# Patient Record
Sex: Male | Born: 1975 | Race: Black or African American | Hispanic: No | Marital: Single
Health system: Southern US, Community
[De-identification: ages and names within clinical notes are randomized; demographics above are authoritative.]

---

## 2005-03-02 ENCOUNTER — Emergency Department: Payer: Self-pay | Admitting: Emergency Medicine

## 2005-11-27 ENCOUNTER — Emergency Department: Payer: Self-pay | Admitting: Internal Medicine

## 2006-01-18 ENCOUNTER — Emergency Department: Payer: Self-pay | Admitting: Emergency Medicine

## 2007-01-09 ENCOUNTER — Emergency Department: Payer: Self-pay | Admitting: Emergency Medicine

## 2007-10-30 IMAGING — CT CT HEAD WITHOUT CONTRAST
2 series · 16 of 30 positions shown, 20 images · non-contrast
Comparison: none

REASON FOR EXAM: Headaches
COMMENTS:  LMP: (Male)

PROCEDURE:     CT  - CT HEAD WITHOUT CONTRAST  - November 27, 2005 [DATE]
RESULT:     Unenhanced head CT scan was obtained for headaches.

[Series 2: without · axial · non-contrast · 0.42mm/px · z∈[-136,-16]mm · 13 of 30 slices shown, 17 images]
[im 3/30  brain]
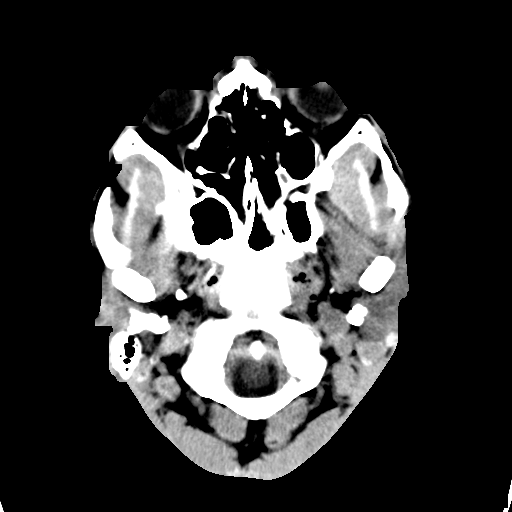
[im 3/30  bone]
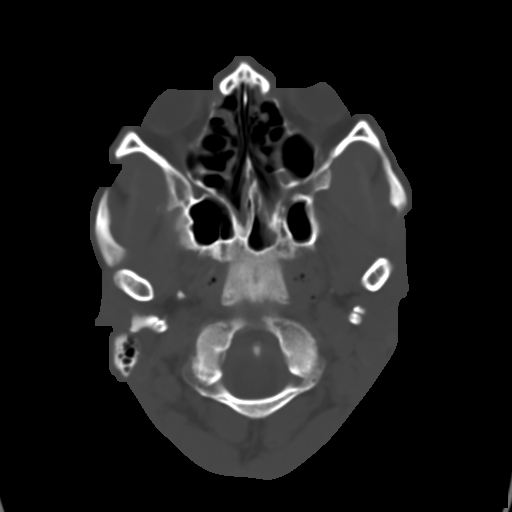
[im 5/30  brain]
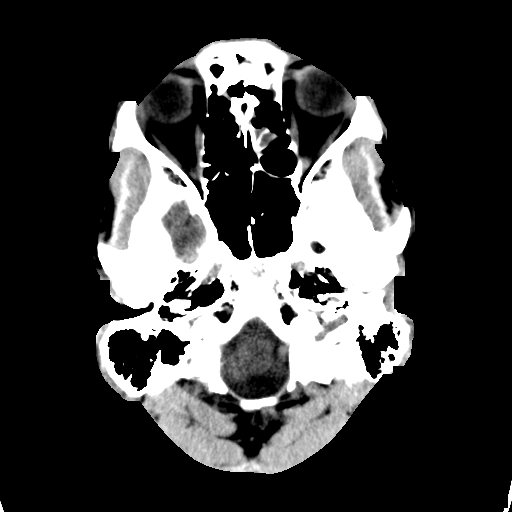
[im 7/30  brain]
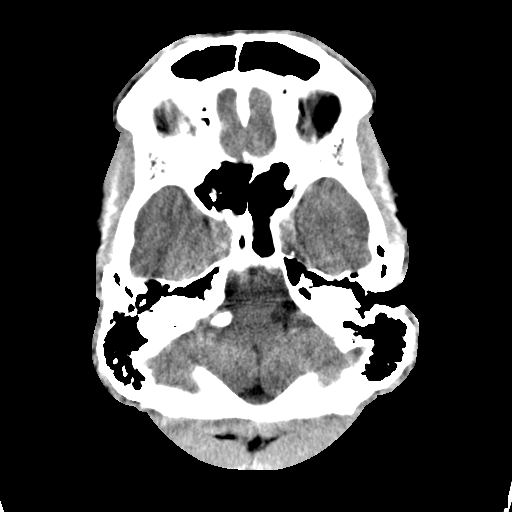
[im 9/30  brain]
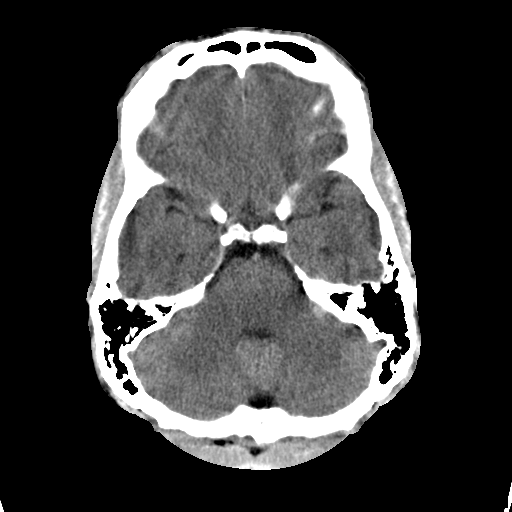
[im 11/30  brain]
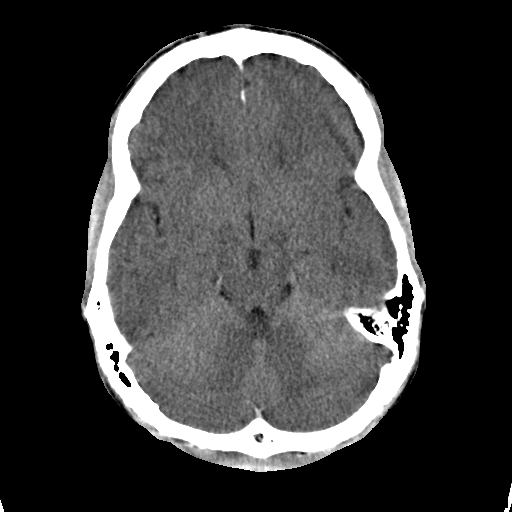
[im 11/30  bone]
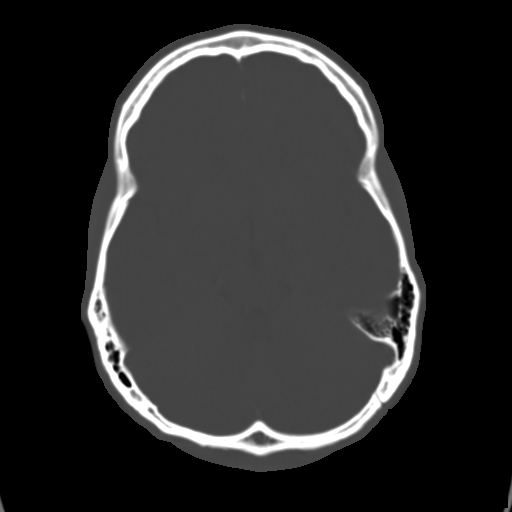
[im 13/30  brain]
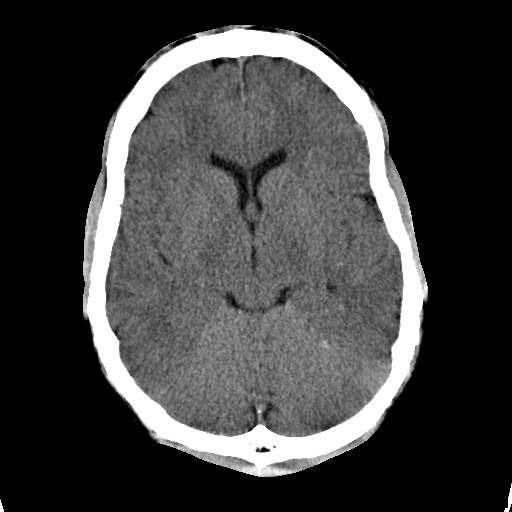
[im 15/30  brain]
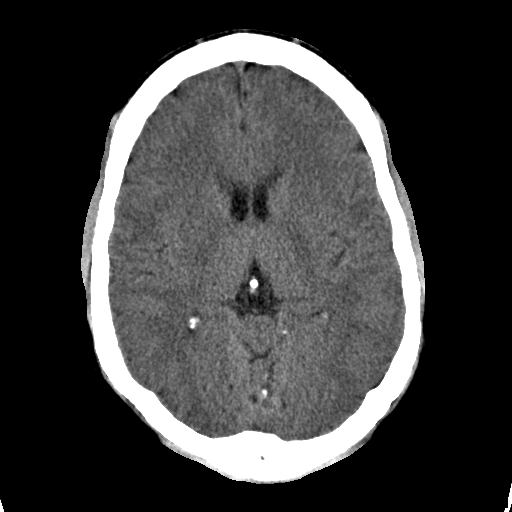
[im 17/30  brain]
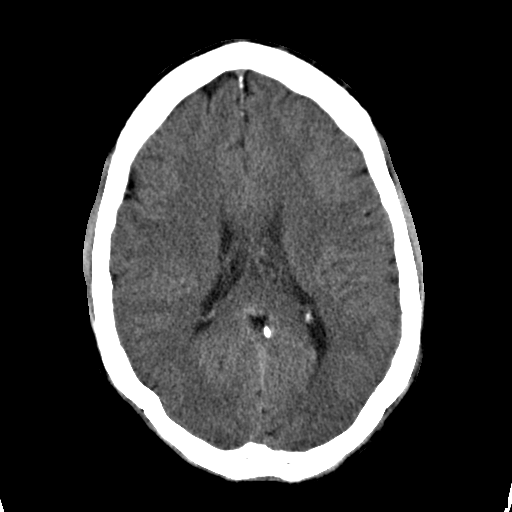
[im 19/30  brain]
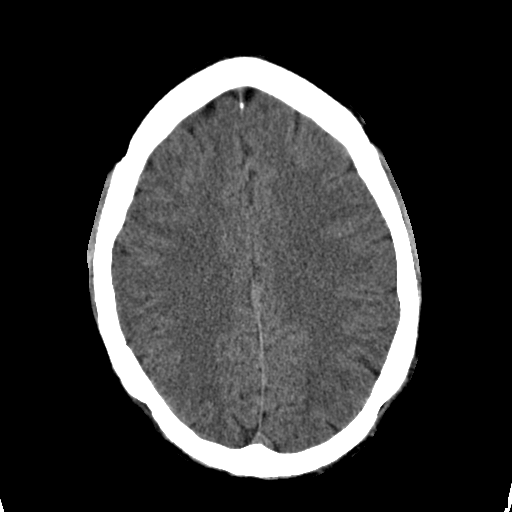
[im 19/30  bone]
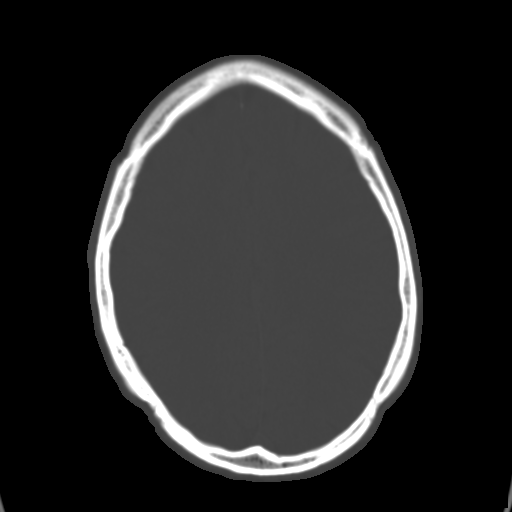
[im 21/30  brain]
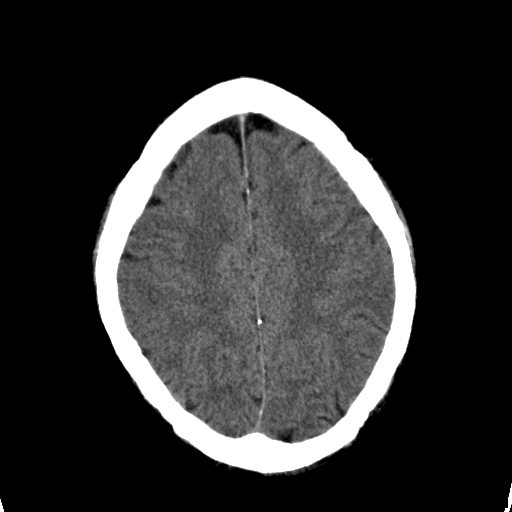
[im 23/30  brain]
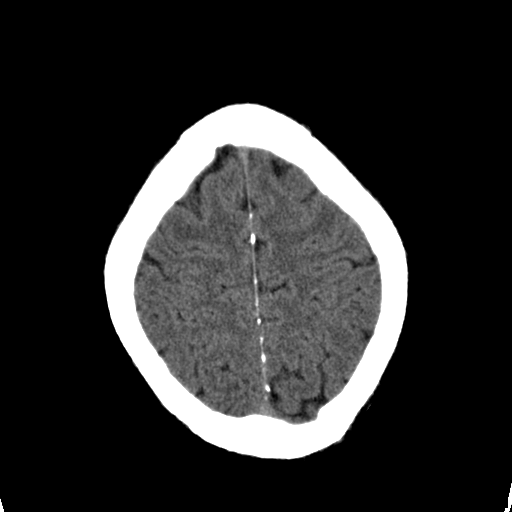
[im 25/30  brain]
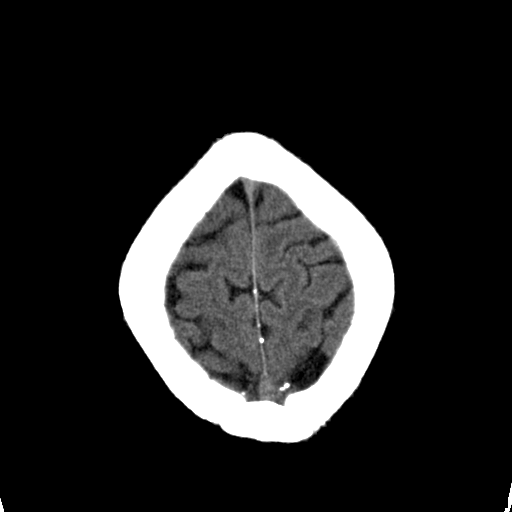
[im 27/30  brain]
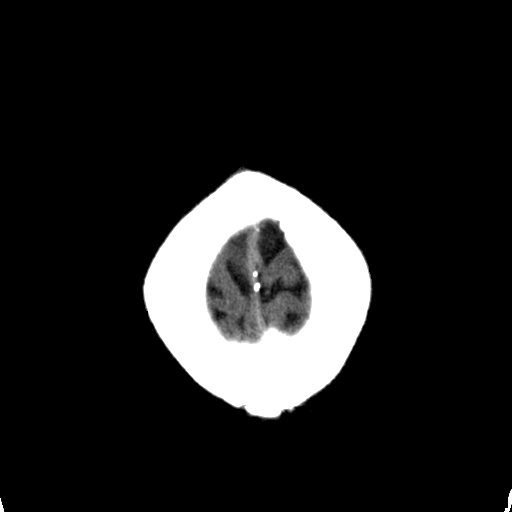
[im 27/30  bone]
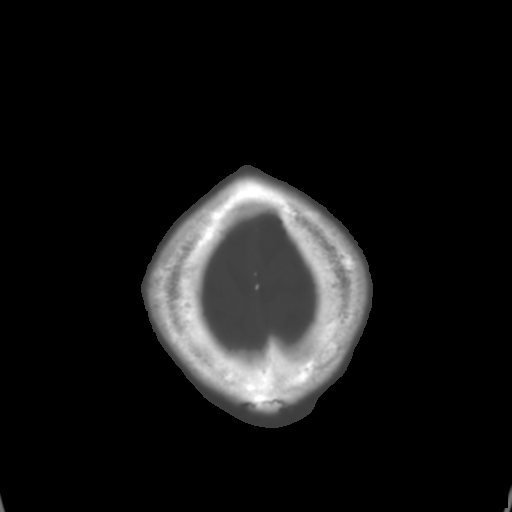

[Series 3: bone · axial · 0.42mm/px · z∈[-136,-96]mm · 3 of 30 slices shown]
[im 3/30  bone]
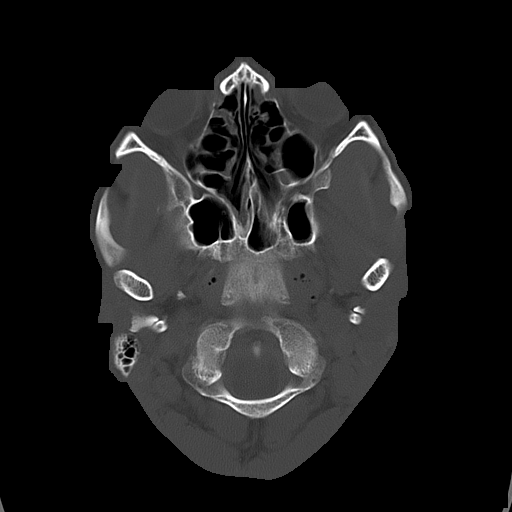
[im 7/30  bone]
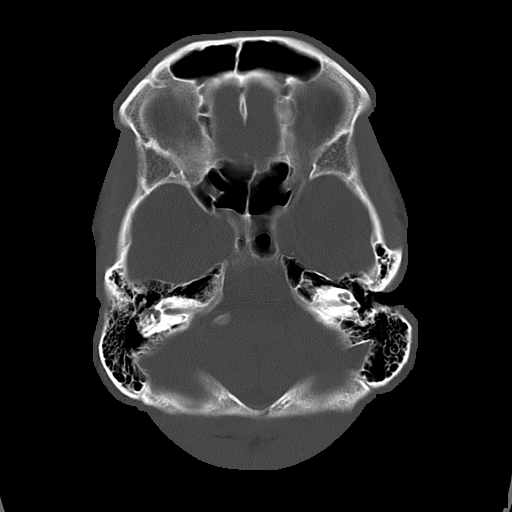
[im 11/30  bone]
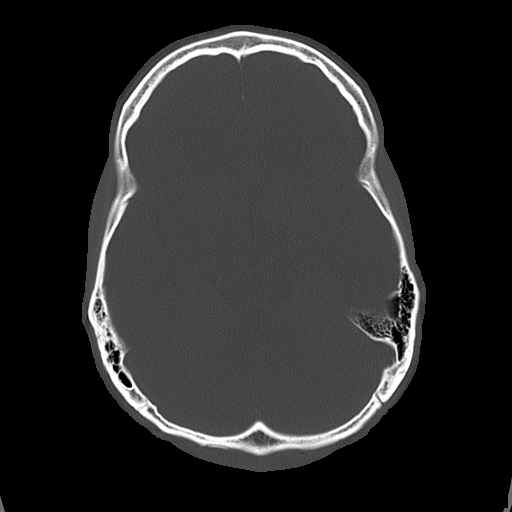

[16 of 30 positions shown; findings below may reference images not displayed]

FINDINGS: No subarachnoid hemorrhage is identified. No intracerebral bleeds
or infarcts are noted. No extra-axial fluid collections are seen. There is
no mass effect. The ventricles appear within normal limits.

A retention cyst is noted in the RIGHT maxillary sinus. The remainder of the
sinuses appears clear.
IMPRESSION: No significant abnormalities are seen on the unenhanced head
CT.

The report was called to the Emergency Room at the conclusion of dictation.

## 2008-01-31 ENCOUNTER — Emergency Department: Payer: Self-pay | Admitting: Emergency Medicine

## 2008-08-02 ENCOUNTER — Emergency Department: Payer: Self-pay | Admitting: Emergency Medicine

## 2008-08-05 ENCOUNTER — Emergency Department: Payer: Self-pay | Admitting: Emergency Medicine

## 2009-05-06 ENCOUNTER — Emergency Department: Payer: Self-pay | Admitting: Emergency Medicine

## 2009-12-09 ENCOUNTER — Emergency Department: Payer: Self-pay | Admitting: Emergency Medicine

## 2010-01-02 IMAGING — CR DG RIBS 2V*L*
1 series · 2 of 2 positions shown · non-contrast
Comparison: None

REASON FOR EXAM: fall
COMMENTS:

PROCEDURE:     DXR - DXR RIBS LEFT UNILATERAL  - January 31, 2008  [DATE]
RESULT:     History: Fall

[Series 1: view not recorded · 0.17mm/px · 2 of 2 slices shown]
[im 1/2]
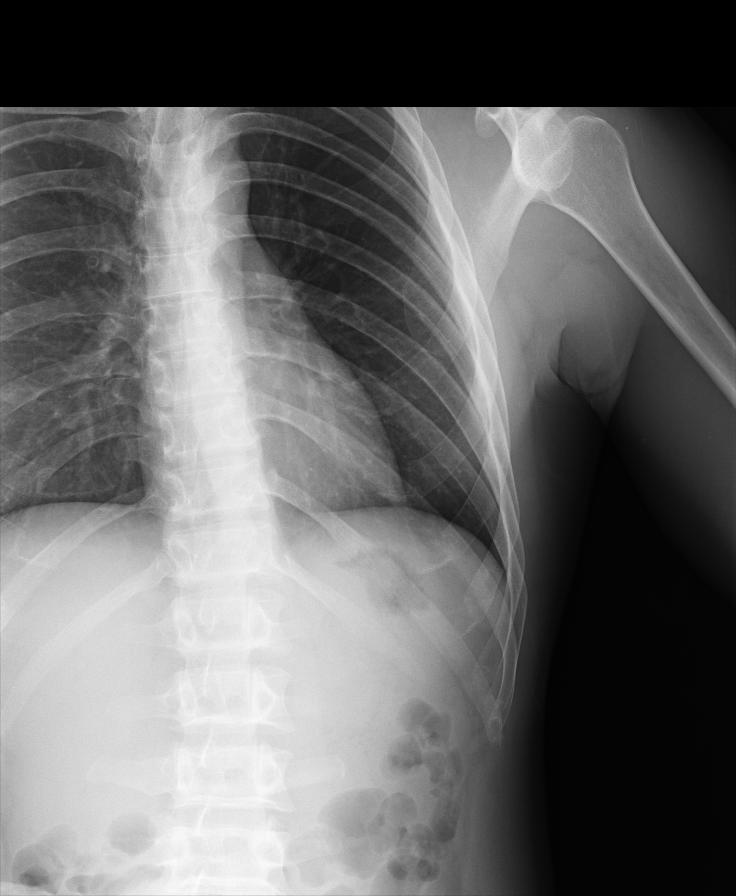
[im 2/2]
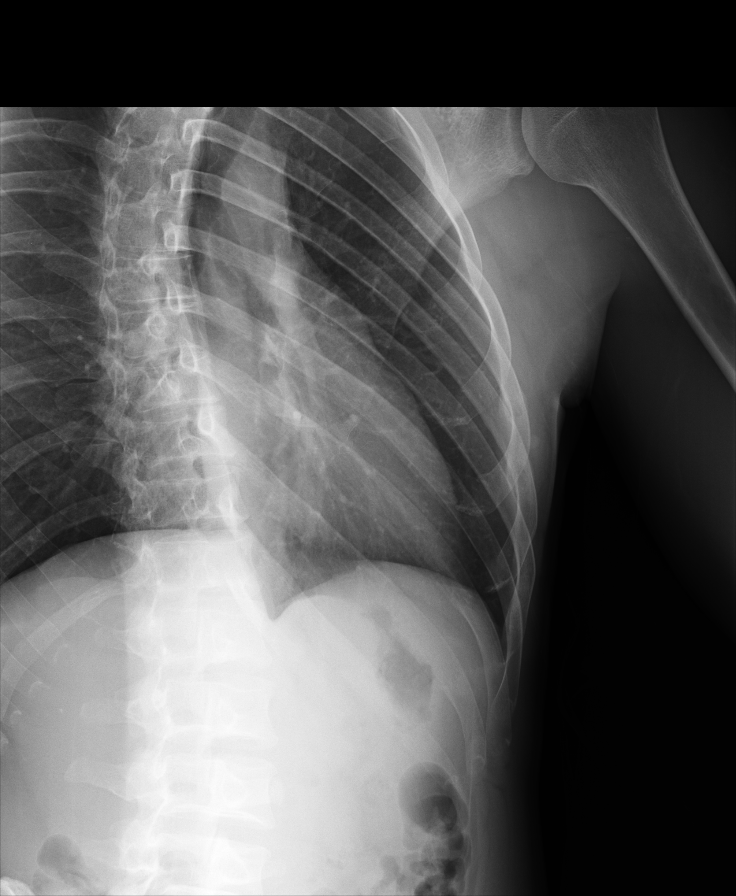

[2 of 2 positions shown; findings below may reference images not displayed]

FINDINGS: Two views of the left ribs demonstrates no rib fracture or rib deformity.
There is no pleural reaction. The visualized portion of the left lung is
clear.
IMPRESSION: No osseous injury of the left ribs.

## 2010-01-02 IMAGING — CR DG LUMBAR SPINE 2-3V
1 series · 3 of 3 positions shown · non-contrast
Comparison: None

REASON FOR EXAM: fall
COMMENTS:

PROCEDURE:     DXR - DXR LUMBAR SPINE AP AND LATERAL  - January 31, 2008  [DATE]
RESULT:     History: Fall

[Series 1: view not recorded · 0.17mm/px · 3 of 3 slices shown]
[im 1/3]
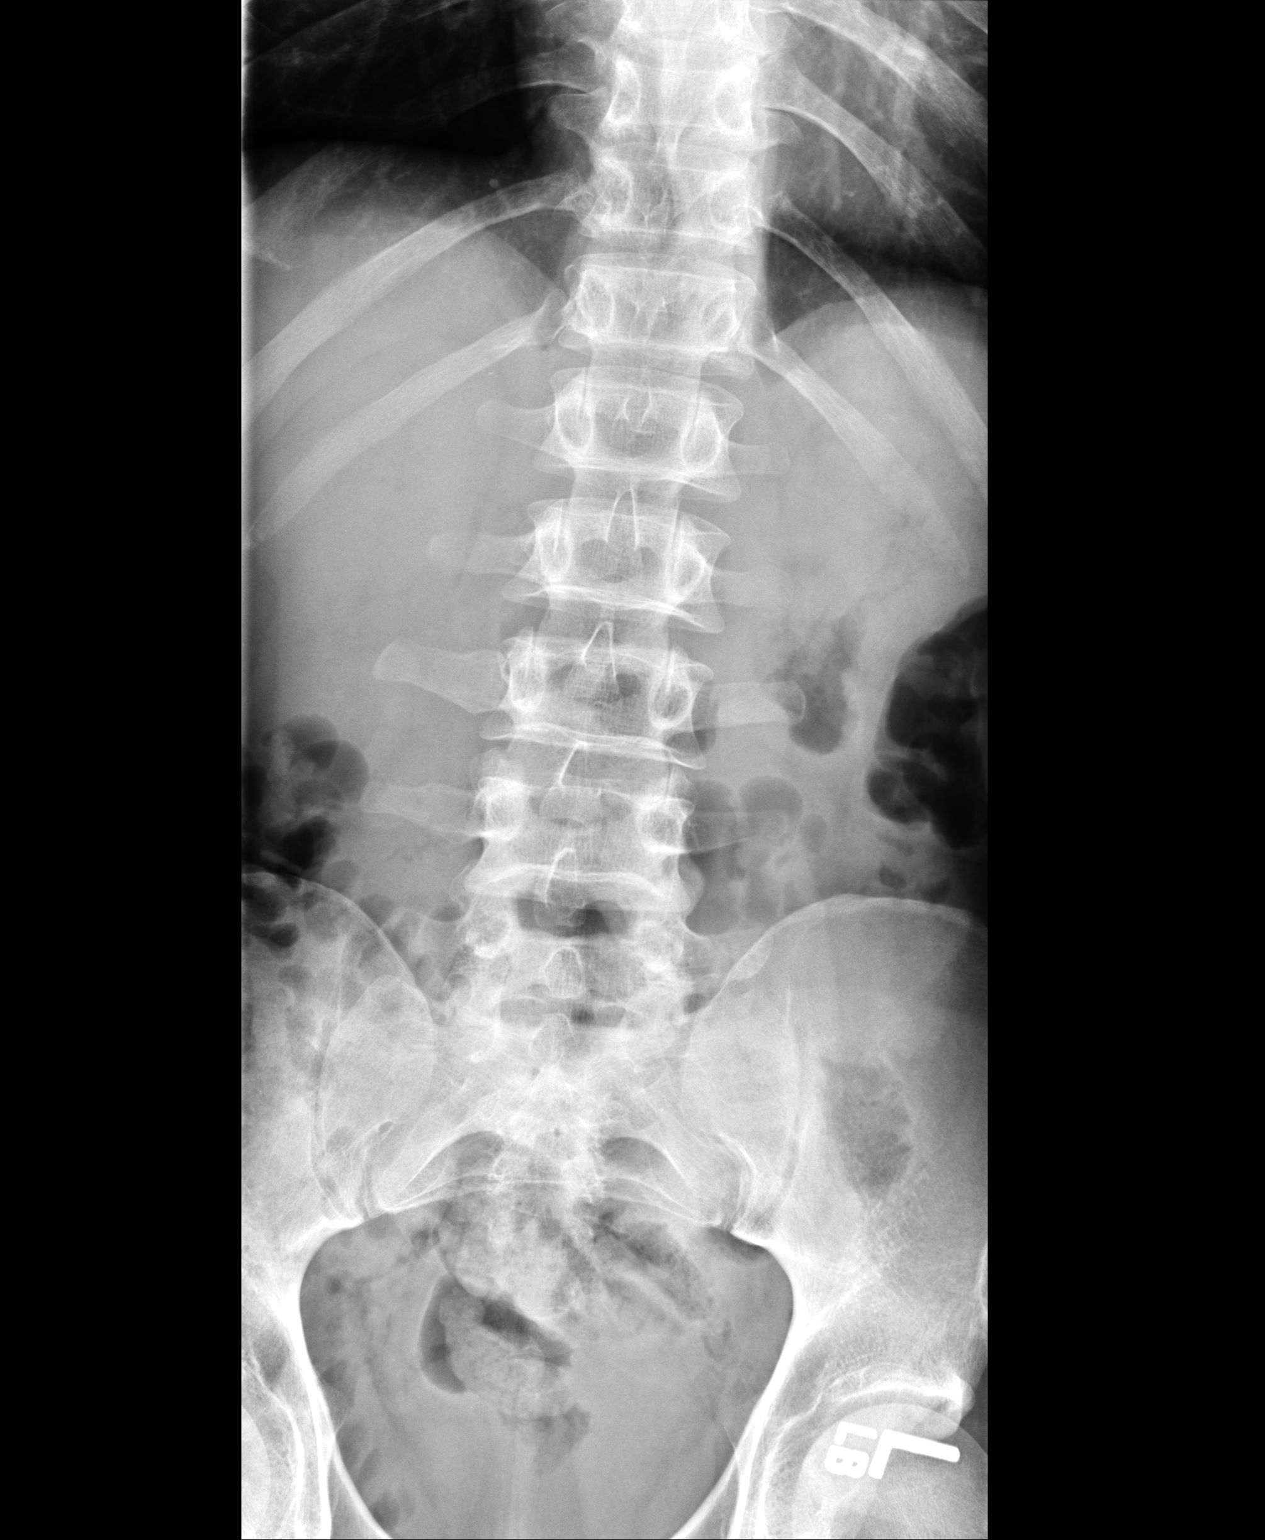
[im 2/3]
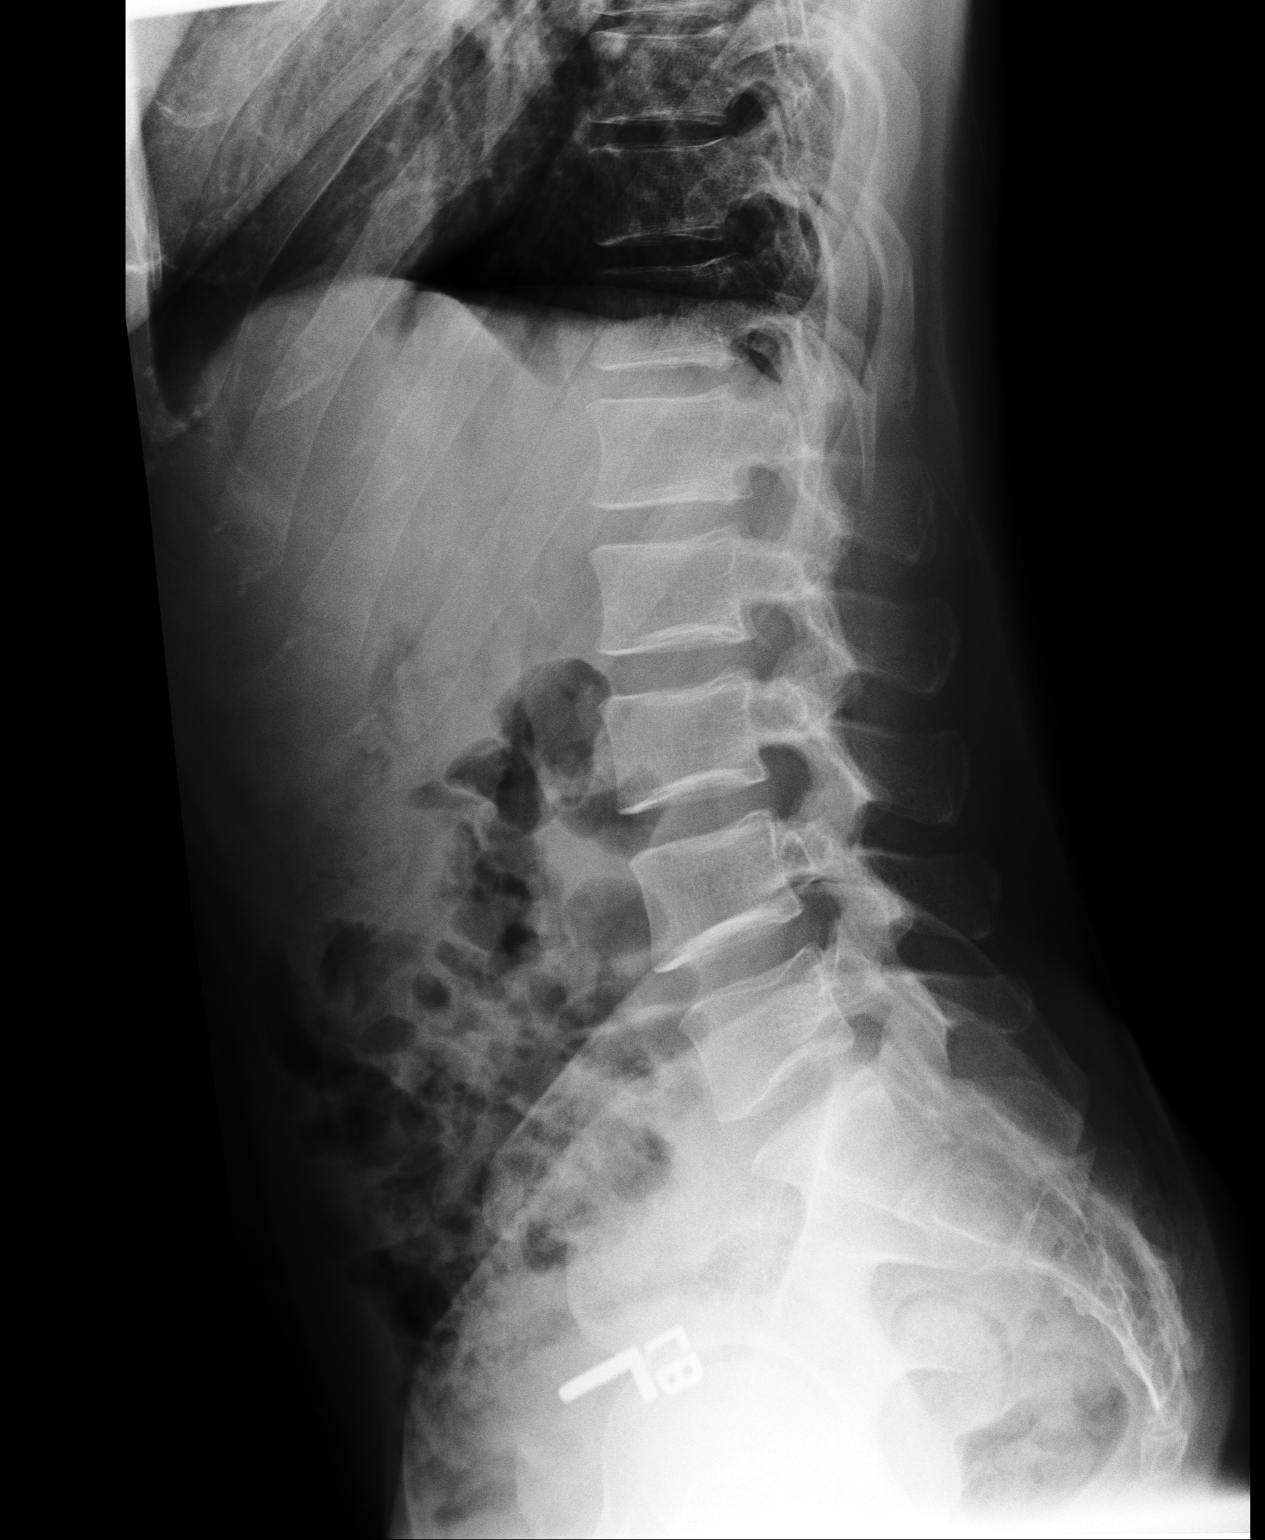
[im 3/3]
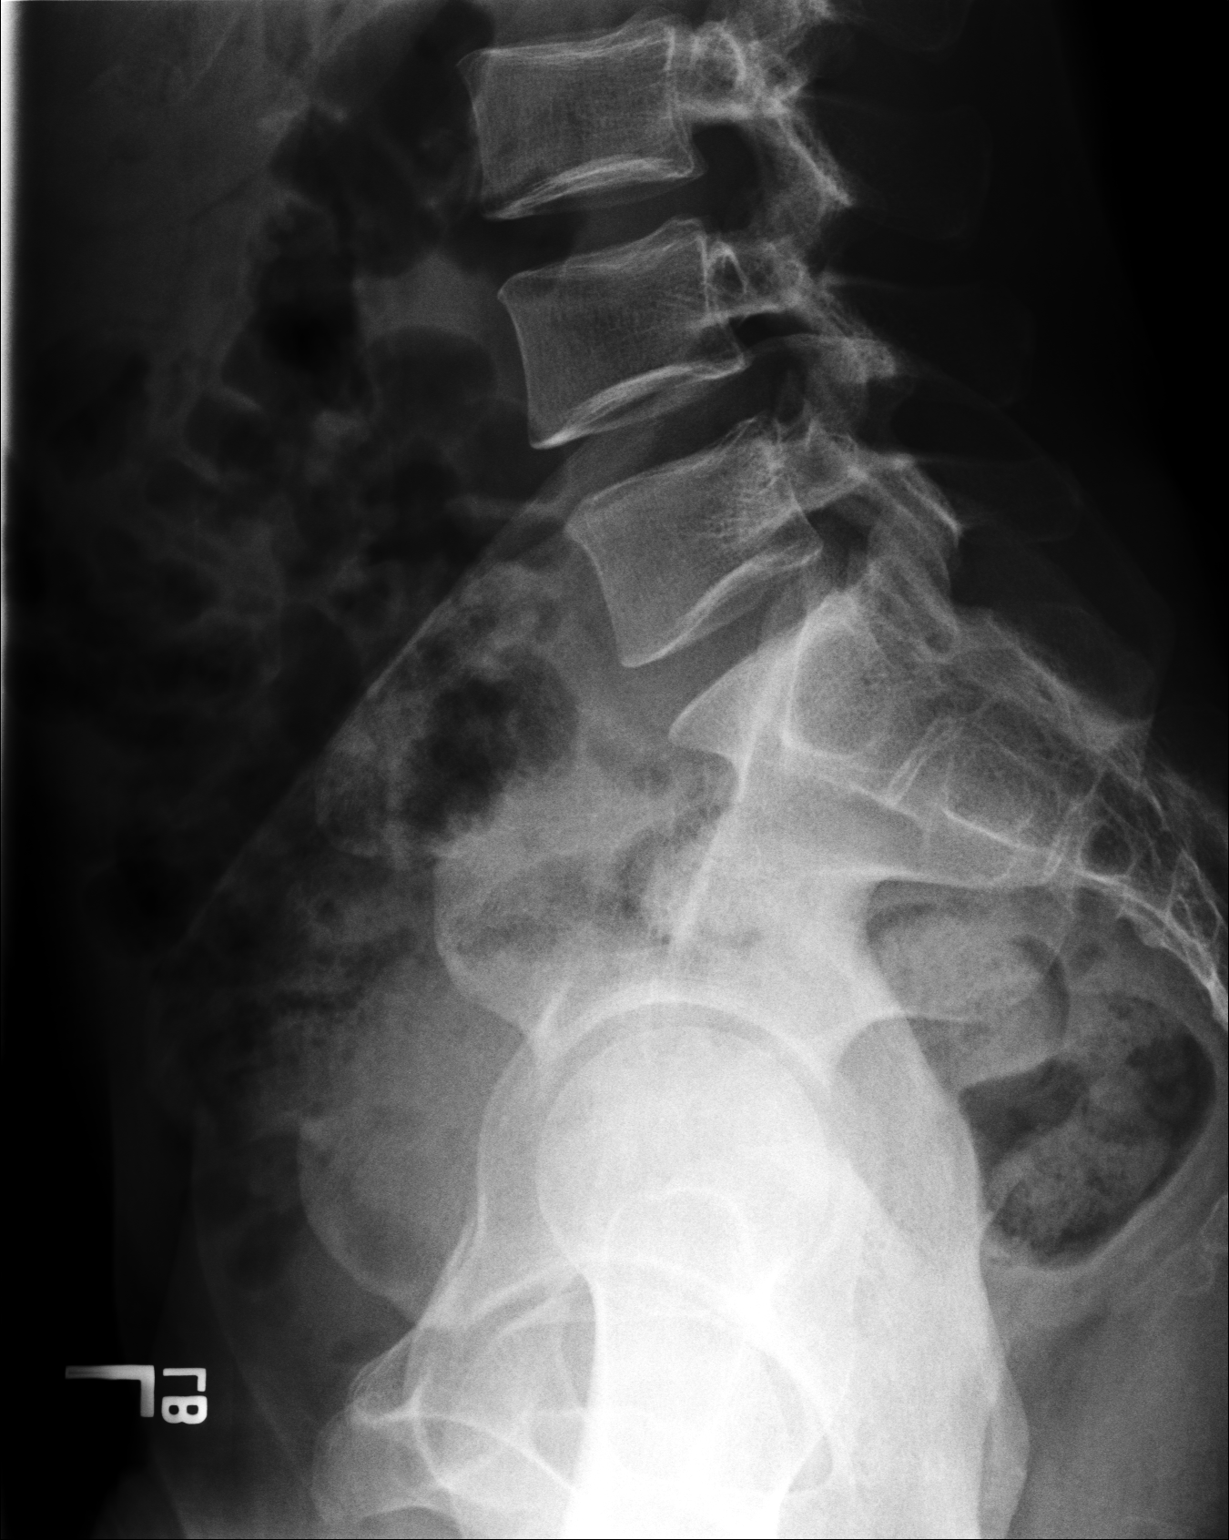

[3 of 3 positions shown; findings below may reference images not displayed]

FINDINGS: AP and lateral views of the lumbar spine and a coned down view of the
lumbosacral junction are provided.

There are 5 nonrib bearing lumbar-type vertebral bodies. The vertebral body
heights are maintained. The alignment is anatomic. There is no
spondylolysis. There is no acute fracture or static listhesis. The disc
spaces are maintained.

The SI joints are unremarkable.
IMPRESSION: 1. No acute osseous abnormality of the lumbar spine

## 2015-08-02 ENCOUNTER — Telehealth: Payer: Self-pay | Admitting: *Deleted

## 2015-08-02 NOTE — Telephone Encounter (Signed)
error
